# Patient Record
Sex: Male | Born: 1975 | Race: White | Hispanic: No | Marital: Married | Smoking: Never smoker
Health system: Southern US, Community
[De-identification: ages and names within clinical notes are randomized; demographics above are authoritative.]

## PROBLEM LIST (undated history)

## (undated) DIAGNOSIS — M109 Gout, unspecified: Secondary | ICD-10-CM

## (undated) DIAGNOSIS — M1990 Unspecified osteoarthritis, unspecified site: Secondary | ICD-10-CM

## (undated) DIAGNOSIS — I10 Essential (primary) hypertension: Secondary | ICD-10-CM

## (undated) DIAGNOSIS — Z87442 Personal history of urinary calculi: Secondary | ICD-10-CM

## (undated) DIAGNOSIS — Z9889 Other specified postprocedural states: Secondary | ICD-10-CM

## (undated) HISTORY — PX: WISDOM TOOTH EXTRACTION: SHX21

## (undated) HISTORY — PX: KNEE SURGERY: SHX244

## (undated) HISTORY — PX: CERVICAL FUSION: SHX112

## (undated) HISTORY — DX: Essential (primary) hypertension: I10

---

## 2006-10-01 ENCOUNTER — Emergency Department (HOSPITAL_COMMUNITY): Admission: EM | Admit: 2006-10-01 | Discharge: 2006-10-02 | Payer: Self-pay | Admitting: Emergency Medicine

## 2008-11-01 IMAGING — CT CT ABDOMEN W/O CM
2 of 4 series · 14 of 32 positions shown, 19 images · IV contrast (agent unspecified)
Comparison: None available.

CLINICAL DATA: Right flank pain.  
 ABDOMEN CT WITHOUT CONTRAST:
TECHNIQUE: Multidetector CT imaging of the abdomen was performed following the standard protocol without IV contrast.
TECHNIQUE: Multidetector CT imaging of the pelvis was performed following the standard protocol without IV contrast.

[Series 2: routine abdomen · axial · 0.74mm/px · z∈[-502,-157]mm · 6 of 96 slices shown, 11 images]
[im 14/96  soft-tissue]
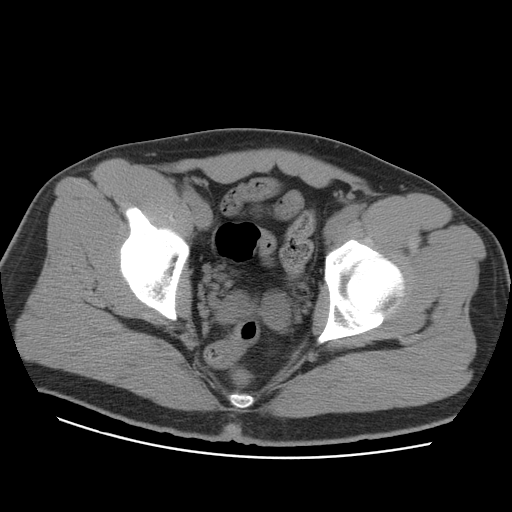
[im 14/96  bone]
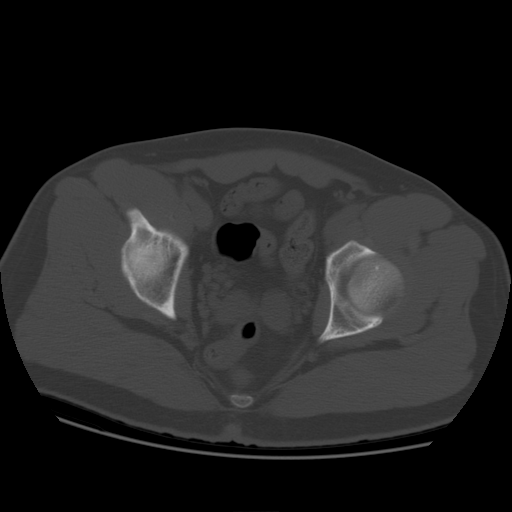
[im 28/96  soft-tissue]
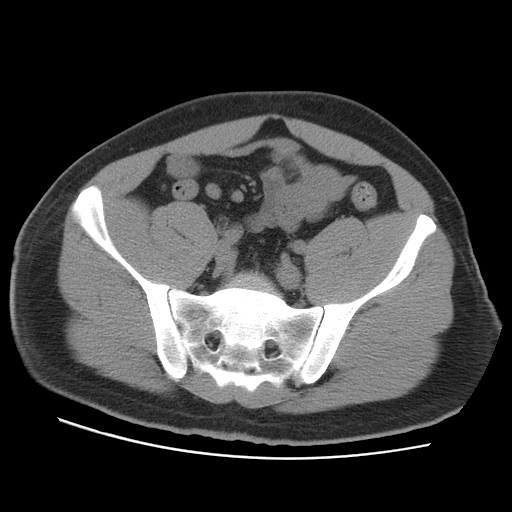
[im 41/96  soft-tissue]
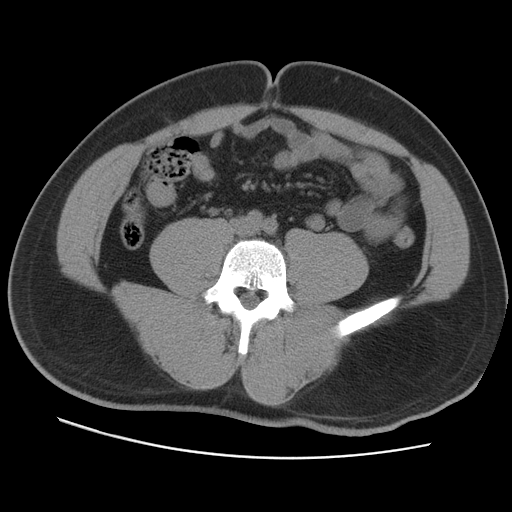
[im 41/96  lung]
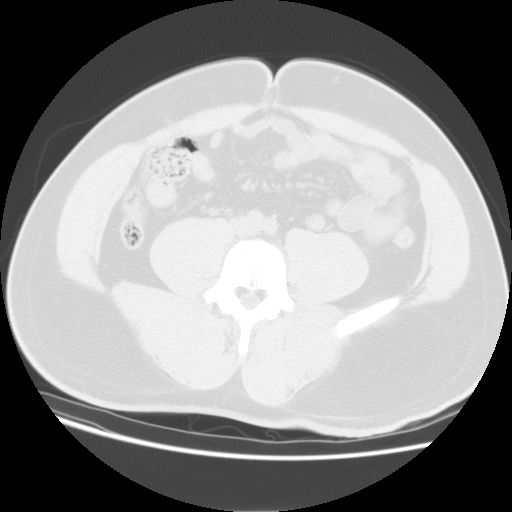
[im 55/96  soft-tissue]
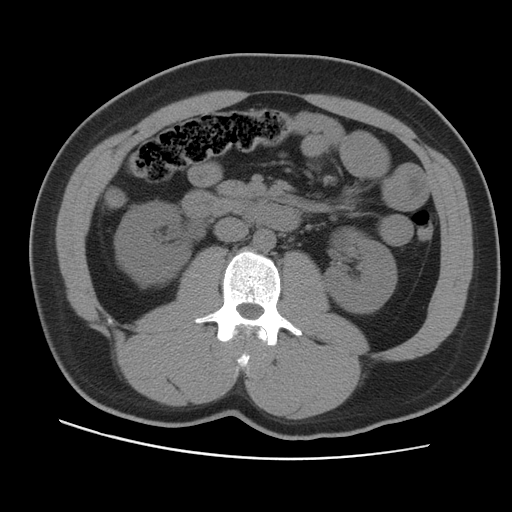
[im 55/96  lung]
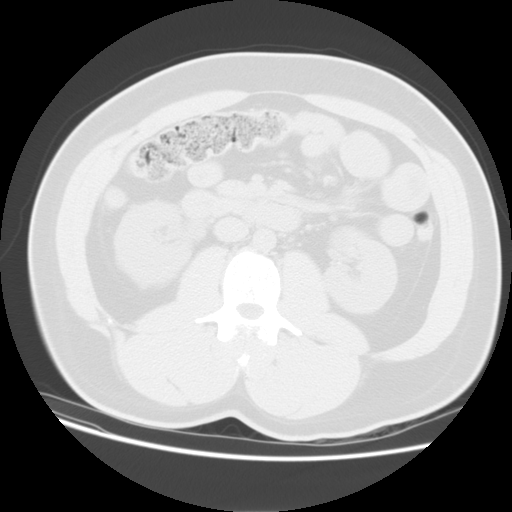
[im 68/96  soft-tissue]
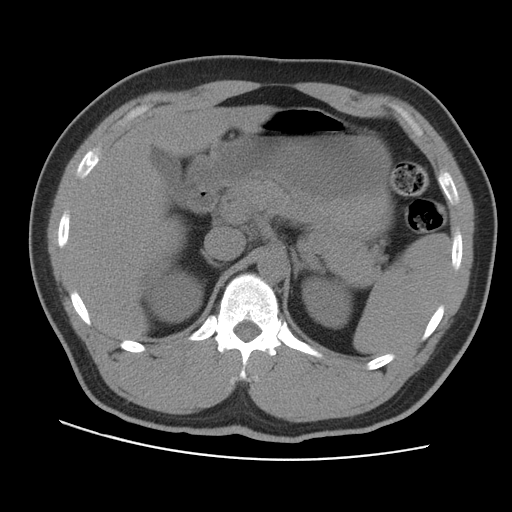
[im 68/96  lung]
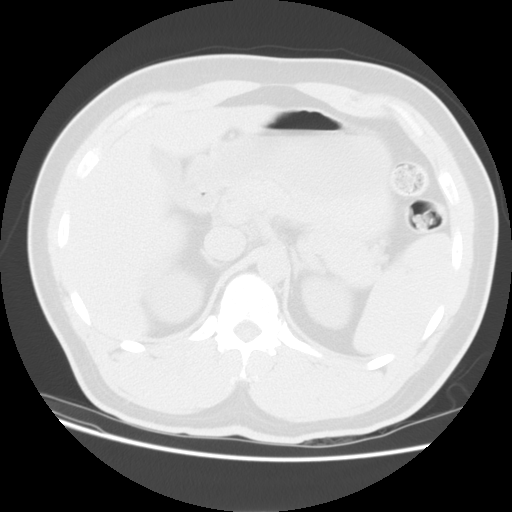
[im 82/96  soft-tissue]
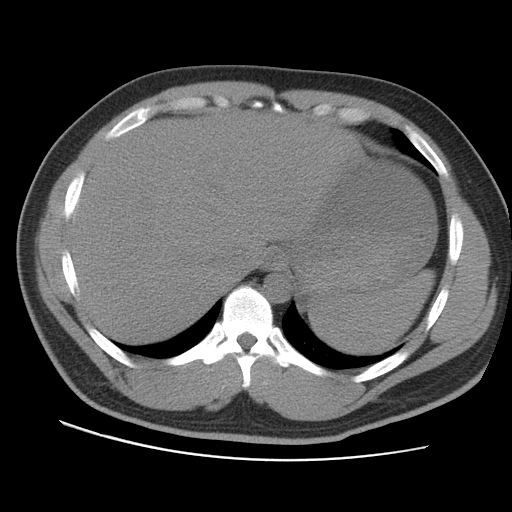
[im 82/96  lung]
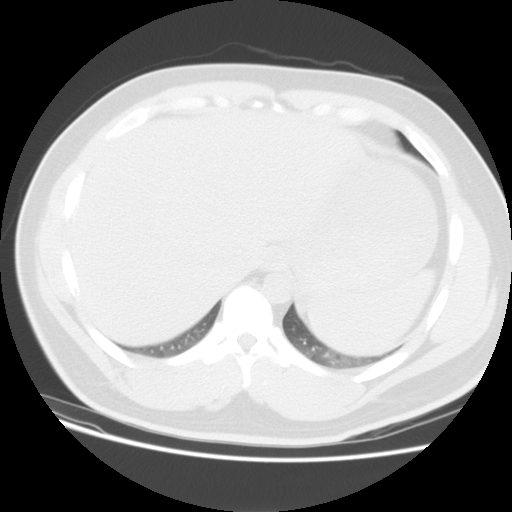

[Series 400: reformatted · sagittal · 1.03mm/px · 8 of 172 slices shown]
[im 16/172  soft-tissue]
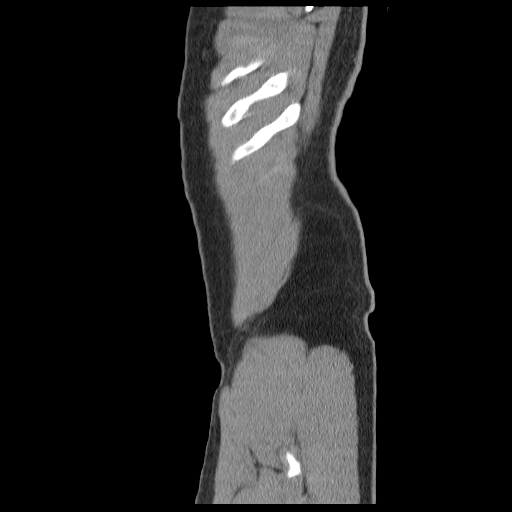
[im 32/172  soft-tissue]
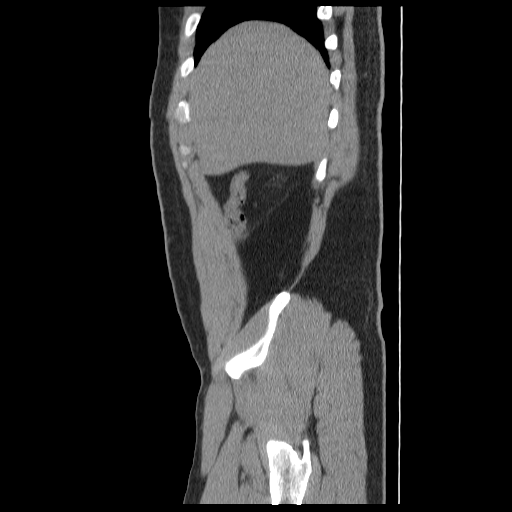
[im 63/172  soft-tissue]
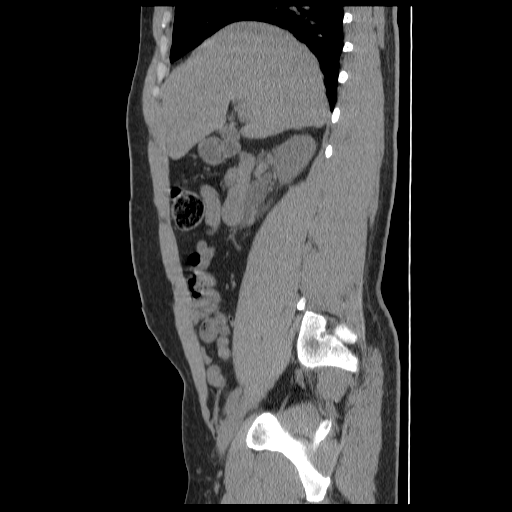
[im 78/172  soft-tissue]
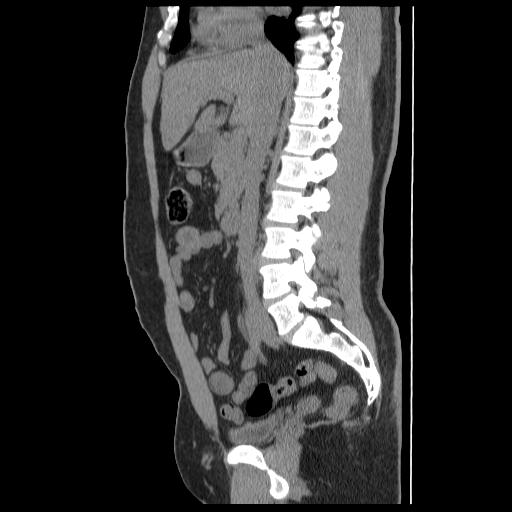
[im 94/172  soft-tissue]
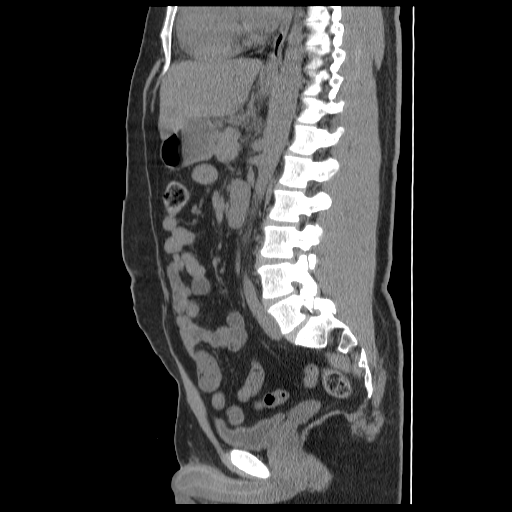
[im 109/172  soft-tissue]
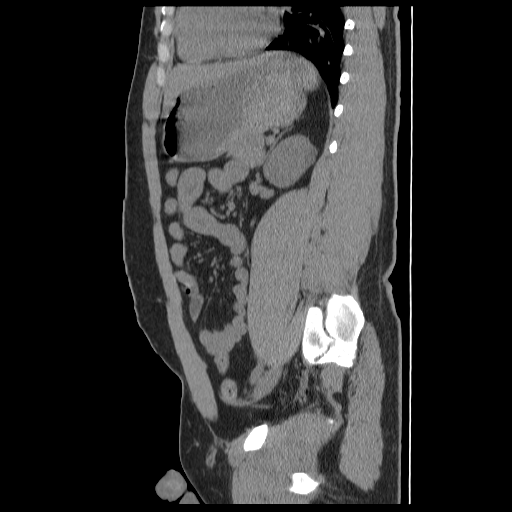
[im 140/172  soft-tissue]
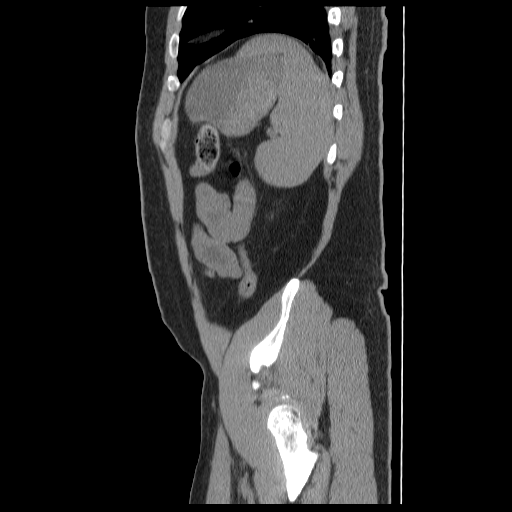
[im 156/172  soft-tissue]
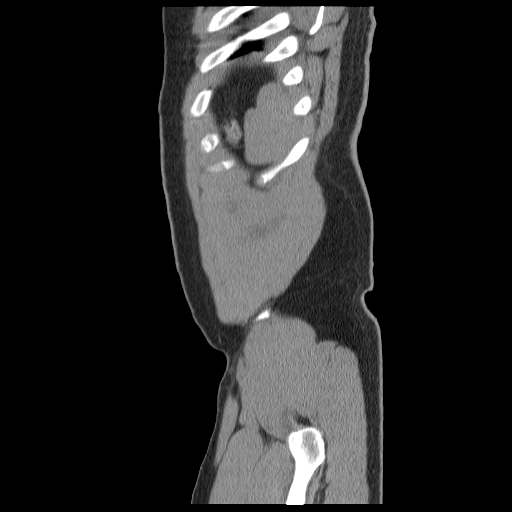

[14 of 32 positions shown; findings below may reference images not displayed]

FINDINGS: No focal abnormality is seen in the liver or spleen on this uninfused stomach.  The stomach is mildly distended.  The duodenum, pancreas, gallbladder and adrenal glands are unremarkable.  
 The right kidney is edematous with some perinephric stranding.  A 2 mm nonobstructing stone is identified in the interpolar right kidney.  There is stranding/edema around the right renal pelvis and proximal right ureter.  No stones are seen in the left kidney.  A 1.9 cm low-density lesion in the anterior left kidney is associated with a slightly exophytic 14 mm posterior low-density left renal lesion. 
 No intraperitoneal free fluid.  No evidence for abdominal lymphadenopathy.
IMPRESSION: 1.  Secondary changes in the right kidney with a 2 mm nonobstructing right renal stone. 
 2.  Two low-density lesions in the left kidney are likely cysts, but cannot be definitively characterized on this uninfused CT scan.  Ultrasound follow-up may prove helpful to confirm that these represent simple cysts. 
 PELVIS CT WITHOUT CONTRAST:
FINDINGS: A 3-4 mm calculus is identified in the right ureterovesical junction.  No evidence for left ureteral stone.  The bladder is decompressed.  There is no intraperitoneal free fluid.  No evidence for pelvic lymphadenopathy.  Terminal ileum is unremarkable.  The appendix has normal features. 
 Bone windows demonstrate bilateral pars defects at the L5 level.
IMPRESSION: A 3-4 mm right UVJ stone causes mild secondary changes in the right kidney.

## 2013-03-27 ENCOUNTER — Encounter: Payer: Self-pay | Admitting: Podiatrist

## 2013-04-01 ENCOUNTER — Ambulatory Visit (INDEPENDENT_AMBULATORY_CARE_PROVIDER_SITE_OTHER): Payer: BC Managed Care – PPO | Admitting: Podiatrist

## 2013-04-01 ENCOUNTER — Encounter: Payer: Self-pay | Admitting: Podiatrist

## 2013-04-01 VITALS — BP 117/81 | HR 89 | Resp 18

## 2013-04-01 DIAGNOSIS — M898X9 Other specified disorders of bone, unspecified site: Secondary | ICD-10-CM

## 2013-04-01 NOTE — Progress Notes (Signed)
Subjective:  Pt. Presents today for follow up biopsy of lesion Left great toenail .  States it is uncomfortable when he flexes down his toe.  He has been taking the clindamycin as instructed but states it is hurting his stomach.  Has also been taking the antiinflammatory medication for the plantar fasciitis right- relates it as being unchanged Objective:  Excellent appearance  Of left great toenail where biopsy and nail was removed along the medial border. No signs of infection present.  Neurovascular status intact and unchanged from last visit.   Assessment:  soft tissue tumor Left Great toe.     Plantar Fasciitis Right Plan:  Recommended continued covering of lesion for 1-2 weeks until full healing has occurred.  May leave open to air at night.  To continue conservative care for plantar fasciitis.  If no improvement, injection would be warranted.  Pathology:  Subungual Exostosis  Marlowe Aschoff, DPM

## 2013-04-01 NOTE — Patient Instructions (Signed)
Keep dressing on the toe during the day for the next 1-2 weeks.  If any signs of infection arise, please call

## 2014-12-30 ENCOUNTER — Ambulatory Visit (INDEPENDENT_AMBULATORY_CARE_PROVIDER_SITE_OTHER): Payer: BLUE CROSS/BLUE SHIELD | Admitting: Podiatry

## 2014-12-30 ENCOUNTER — Ambulatory Visit (INDEPENDENT_AMBULATORY_CARE_PROVIDER_SITE_OTHER): Payer: BLUE CROSS/BLUE SHIELD

## 2014-12-30 ENCOUNTER — Encounter: Payer: Self-pay | Admitting: Podiatry

## 2014-12-30 VITALS — BP 127/74 | HR 123 | Resp 15

## 2014-12-30 DIAGNOSIS — R609 Edema, unspecified: Secondary | ICD-10-CM | POA: Diagnosis not present

## 2014-12-30 DIAGNOSIS — M79671 Pain in right foot: Secondary | ICD-10-CM | POA: Diagnosis not present

## 2014-12-30 DIAGNOSIS — M779 Enthesopathy, unspecified: Secondary | ICD-10-CM

## 2014-12-30 MED ORDER — DICLOFENAC SODIUM 75 MG PO TBEC: 75.0000 mg | DELAYED_RELEASE_TABLET | Freq: Two times a day (BID) | ORAL | Status: DC

## 2014-12-30 NOTE — Progress Notes (Signed)
   Subjective:    Patient ID: Jason HarmanStacy L Mata, male    DOB: June 05, 1976, 39 y.o.   MRN: [id number]  HPI Pt presents with pain in right foot from previous injury 1 week prior, he was on a water slide and states that he rolled his foot backward. He states there has been intermittent pain since but recently pain has gotten severe. C/o pain on dorsal area of foot radiating upward   Review of Systems  All other systems reviewed and are negative.      Objective:   Physical Exam        Assessment & Plan:

## 2014-12-31 NOTE — Progress Notes (Signed)
Subjective:     Patient ID: Jason HarmanStacy L Mata, male   DOB: [birth year], 39 y.o.   MRN: [id number]  HPI patient presents stating that he injured his right big toe week ago on a water slide and that it seemed to be doing okay and that it got worse. It's gotten a little bit better now again but still can bother him   Review of Systems     Objective:   Physical Exam Neurovascular status intact muscle strength adequate with no other changes in health history with good range of motion of the first MPJ no crepitus and no indication of a tear of the capsule    Assessment:     Probable inflammation of the right first MPJ with no current indications of soft tissue injury but cannot rule out bone injury    Plan:     H&P and x-rays reviewed and placed on oral anti-inflammatory ice therapy and rigid bottom shoes. If symptoms persist patient is to reappoint

## 2015-11-29 DIAGNOSIS — R002 Palpitations: Secondary | ICD-10-CM | POA: Insufficient documentation

## 2015-11-30 ENCOUNTER — Ambulatory Visit (INDEPENDENT_AMBULATORY_CARE_PROVIDER_SITE_OTHER): Payer: BLUE CROSS/BLUE SHIELD

## 2015-11-30 DIAGNOSIS — R002 Palpitations: Secondary | ICD-10-CM

## 2017-06-20 DIAGNOSIS — I10 Essential (primary) hypertension: Secondary | ICD-10-CM | POA: Diagnosis not present

## 2017-06-20 DIAGNOSIS — E785 Hyperlipidemia, unspecified: Secondary | ICD-10-CM | POA: Diagnosis not present

## 2017-06-20 DIAGNOSIS — S30863A Insect bite (nonvenomous) of scrotum and testes, initial encounter: Secondary | ICD-10-CM | POA: Diagnosis not present

## 2017-06-20 DIAGNOSIS — M2550 Pain in unspecified joint: Secondary | ICD-10-CM | POA: Diagnosis not present

## 2017-06-20 DIAGNOSIS — Z0000 Encounter for general adult medical examination without abnormal findings: Secondary | ICD-10-CM | POA: Diagnosis not present

## 2017-06-20 DIAGNOSIS — M109 Gout, unspecified: Secondary | ICD-10-CM | POA: Diagnosis not present

## 2017-07-19 DIAGNOSIS — G4710 Hypersomnia, unspecified: Secondary | ICD-10-CM | POA: Diagnosis not present

## 2017-12-18 DIAGNOSIS — E785 Hyperlipidemia, unspecified: Secondary | ICD-10-CM | POA: Diagnosis not present

## 2017-12-18 DIAGNOSIS — M109 Gout, unspecified: Secondary | ICD-10-CM | POA: Diagnosis not present

## 2017-12-18 DIAGNOSIS — I10 Essential (primary) hypertension: Secondary | ICD-10-CM | POA: Diagnosis not present

## 2018-05-07 DIAGNOSIS — G8929 Other chronic pain: Secondary | ICD-10-CM | POA: Diagnosis not present

## 2018-05-07 DIAGNOSIS — M542 Cervicalgia: Secondary | ICD-10-CM | POA: Diagnosis not present

## 2018-05-07 DIAGNOSIS — M545 Low back pain: Secondary | ICD-10-CM | POA: Diagnosis not present

## 2018-05-15 DIAGNOSIS — I10 Essential (primary) hypertension: Secondary | ICD-10-CM | POA: Diagnosis not present

## 2018-05-16 DIAGNOSIS — Z135 Encounter for screening for eye and ear disorders: Secondary | ICD-10-CM | POA: Diagnosis not present

## 2018-05-16 DIAGNOSIS — M542 Cervicalgia: Secondary | ICD-10-CM | POA: Diagnosis not present

## 2018-05-16 DIAGNOSIS — M4802 Spinal stenosis, cervical region: Secondary | ICD-10-CM | POA: Diagnosis not present

## 2018-05-21 DIAGNOSIS — Z6833 Body mass index (BMI) 33.0-33.9, adult: Secondary | ICD-10-CM | POA: Diagnosis not present

## 2018-05-21 DIAGNOSIS — M47816 Spondylosis without myelopathy or radiculopathy, lumbar region: Secondary | ICD-10-CM | POA: Diagnosis not present

## 2018-05-27 DIAGNOSIS — Z6833 Body mass index (BMI) 33.0-33.9, adult: Secondary | ICD-10-CM | POA: Diagnosis not present

## 2018-05-27 DIAGNOSIS — M5416 Radiculopathy, lumbar region: Secondary | ICD-10-CM | POA: Diagnosis not present

## 2018-05-28 DIAGNOSIS — M5416 Radiculopathy, lumbar region: Secondary | ICD-10-CM | POA: Diagnosis not present

## 2018-05-28 DIAGNOSIS — M545 Low back pain: Secondary | ICD-10-CM | POA: Diagnosis not present

## 2018-05-28 DIAGNOSIS — M5412 Radiculopathy, cervical region: Secondary | ICD-10-CM | POA: Diagnosis not present

## 2018-05-30 DIAGNOSIS — M5412 Radiculopathy, cervical region: Secondary | ICD-10-CM | POA: Diagnosis not present

## 2018-05-30 DIAGNOSIS — M5416 Radiculopathy, lumbar region: Secondary | ICD-10-CM | POA: Diagnosis not present

## 2018-05-30 DIAGNOSIS — M545 Low back pain: Secondary | ICD-10-CM | POA: Diagnosis not present

## 2018-06-04 DIAGNOSIS — M5416 Radiculopathy, lumbar region: Secondary | ICD-10-CM | POA: Diagnosis not present

## 2018-06-04 DIAGNOSIS — M545 Low back pain: Secondary | ICD-10-CM | POA: Diagnosis not present

## 2018-06-04 DIAGNOSIS — M5412 Radiculopathy, cervical region: Secondary | ICD-10-CM | POA: Diagnosis not present

## 2018-06-07 DIAGNOSIS — M5416 Radiculopathy, lumbar region: Secondary | ICD-10-CM | POA: Diagnosis not present

## 2018-06-07 DIAGNOSIS — M545 Low back pain: Secondary | ICD-10-CM | POA: Diagnosis not present

## 2018-06-07 DIAGNOSIS — M5412 Radiculopathy, cervical region: Secondary | ICD-10-CM | POA: Diagnosis not present

## 2018-06-12 DIAGNOSIS — M545 Low back pain: Secondary | ICD-10-CM | POA: Diagnosis not present

## 2018-06-12 DIAGNOSIS — M5416 Radiculopathy, lumbar region: Secondary | ICD-10-CM | POA: Diagnosis not present

## 2018-06-12 DIAGNOSIS — M5412 Radiculopathy, cervical region: Secondary | ICD-10-CM | POA: Diagnosis not present

## 2018-06-13 DIAGNOSIS — M5412 Radiculopathy, cervical region: Secondary | ICD-10-CM | POA: Diagnosis not present

## 2018-06-13 DIAGNOSIS — M5416 Radiculopathy, lumbar region: Secondary | ICD-10-CM | POA: Diagnosis not present

## 2018-06-13 DIAGNOSIS — M545 Low back pain: Secondary | ICD-10-CM | POA: Diagnosis not present

## 2018-06-16 DIAGNOSIS — M545 Low back pain: Secondary | ICD-10-CM | POA: Diagnosis not present

## 2018-06-16 DIAGNOSIS — M5416 Radiculopathy, lumbar region: Secondary | ICD-10-CM | POA: Diagnosis not present

## 2018-06-16 DIAGNOSIS — M5412 Radiculopathy, cervical region: Secondary | ICD-10-CM | POA: Diagnosis not present

## 2018-06-20 DIAGNOSIS — M545 Low back pain: Secondary | ICD-10-CM | POA: Diagnosis not present

## 2018-06-20 DIAGNOSIS — M5416 Radiculopathy, lumbar region: Secondary | ICD-10-CM | POA: Diagnosis not present

## 2018-06-21 DIAGNOSIS — M5412 Radiculopathy, cervical region: Secondary | ICD-10-CM | POA: Diagnosis not present

## 2018-06-21 DIAGNOSIS — M545 Low back pain: Secondary | ICD-10-CM | POA: Diagnosis not present

## 2018-06-21 DIAGNOSIS — M5416 Radiculopathy, lumbar region: Secondary | ICD-10-CM | POA: Diagnosis not present

## 2018-06-24 DIAGNOSIS — M5416 Radiculopathy, lumbar region: Secondary | ICD-10-CM | POA: Diagnosis not present

## 2018-06-24 DIAGNOSIS — M5412 Radiculopathy, cervical region: Secondary | ICD-10-CM | POA: Diagnosis not present

## 2018-06-24 DIAGNOSIS — M545 Low back pain: Secondary | ICD-10-CM | POA: Diagnosis not present

## 2018-06-28 DIAGNOSIS — M5416 Radiculopathy, lumbar region: Secondary | ICD-10-CM | POA: Diagnosis not present

## 2018-06-28 DIAGNOSIS — M545 Low back pain: Secondary | ICD-10-CM | POA: Diagnosis not present

## 2018-06-28 DIAGNOSIS — M5412 Radiculopathy, cervical region: Secondary | ICD-10-CM | POA: Diagnosis not present

## 2018-07-01 DIAGNOSIS — M5416 Radiculopathy, lumbar region: Secondary | ICD-10-CM | POA: Diagnosis not present

## 2018-07-01 DIAGNOSIS — M5412 Radiculopathy, cervical region: Secondary | ICD-10-CM | POA: Diagnosis not present

## 2018-07-01 DIAGNOSIS — M545 Low back pain: Secondary | ICD-10-CM | POA: Diagnosis not present

## 2018-07-08 DIAGNOSIS — Z0000 Encounter for general adult medical examination without abnormal findings: Secondary | ICD-10-CM | POA: Diagnosis not present

## 2018-07-08 DIAGNOSIS — I10 Essential (primary) hypertension: Secondary | ICD-10-CM | POA: Diagnosis not present

## 2018-07-08 DIAGNOSIS — M109 Gout, unspecified: Secondary | ICD-10-CM | POA: Diagnosis not present

## 2018-07-08 DIAGNOSIS — E785 Hyperlipidemia, unspecified: Secondary | ICD-10-CM | POA: Diagnosis not present

## 2018-07-16 DIAGNOSIS — M4317 Spondylolisthesis, lumbosacral region: Secondary | ICD-10-CM | POA: Diagnosis not present

## 2019-01-10 DIAGNOSIS — E785 Hyperlipidemia, unspecified: Secondary | ICD-10-CM | POA: Diagnosis not present

## 2020-04-23 ENCOUNTER — Encounter: Payer: Self-pay | Admitting: Sports Medicine

## 2020-04-23 ENCOUNTER — Other Ambulatory Visit: Payer: Self-pay

## 2020-04-23 ENCOUNTER — Ambulatory Visit (INDEPENDENT_AMBULATORY_CARE_PROVIDER_SITE_OTHER): Payer: 59

## 2020-04-23 ENCOUNTER — Ambulatory Visit: Payer: BLUE CROSS/BLUE SHIELD | Admitting: Sports Medicine

## 2020-04-23 DIAGNOSIS — M722 Plantar fascial fibromatosis: Secondary | ICD-10-CM | POA: Diagnosis not present

## 2020-04-23 DIAGNOSIS — Q667 Congenital pes cavus, unspecified foot: Secondary | ICD-10-CM

## 2020-04-23 DIAGNOSIS — M79672 Pain in left foot: Secondary | ICD-10-CM

## 2020-04-23 DIAGNOSIS — M79671 Pain in right foot: Secondary | ICD-10-CM | POA: Diagnosis not present

## 2020-04-23 DIAGNOSIS — Q6672 Congenital pes cavus, left foot: Secondary | ICD-10-CM

## 2020-04-23 DIAGNOSIS — M205X9 Other deformities of toe(s) (acquired), unspecified foot: Secondary | ICD-10-CM | POA: Diagnosis not present

## 2020-04-23 DIAGNOSIS — Q6671 Congenital pes cavus, right foot: Secondary | ICD-10-CM | POA: Diagnosis not present

## 2020-04-23 NOTE — Progress Notes (Signed)
Subjective: Jason Mata is a 44 y.o. male patient presents to office with complaint of general foot pain to both reports that he has high arches and some soreness and a bump of bone that he thinks that is a bunion at the big toe joint left greater than right reports that when he has worn over-the-counter orthotics that seem to help but they do not last long and is interested in getting custom orthotics.  Patient denies any other pedal complaints at this time..   Patient Active Problem List   Diagnosis Date Noted  . Palpitations 11/29/2015  . 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 atorvastatin (LIPITOR) 20 MG tablet Take 20 mg by mouth daily.    Marland Kitchen lisinopril (PRINIVIL,ZESTRIL) 10 MG tablet Take 10 mg by mouth daily.     No current facility-administered medications on file prior to visit.    Allergies  Allergen Reactions  . Valium [Diazepam]     Objective: Physical Exam General: The patient is alert and oriented x3 in no acute distress.  Dermatology: Skin is warm, dry and supple bilateral lower extremities. Nails 1-10 are normal. There is no erythema, edema, no eccymosis, no open lesions present. Integument is otherwise unremarkable.  Vascular: Dorsalis Pedis pulse and Posterior Tibial pulse are 2/4 bilateral. Capillary fill time is immediate to all digits.  Neurological: Grossly intact to light touch bilateral.  Musculoskeletal: No reproducible tenderness to palpation to plantar fascia at right or left foot however patient does have a history of heel pain that is much improved when he wears orthotics.  Patient has a high arch/pes cavus foot type.  There is limited first MPJ range of motion bilateral left greater than right with medial eminence consistent with bunion and hallux rigidus.  Patient has a history of gout.   Xray, Right/Left foot:  Normal osseous mineralization. Joint spaces preserved  except at first MPJs bilateral where there is joint space narrowing and significant medial eminence left greater than right consistent with bunion and hallux rigidus and also history of gout.  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  No fracture/dislocation/boney destruction. Calcaneal spur present with mild thickening of plantar fascia. No other soft tissue abnormalities or radiopaque foreign bodies.   Assessment and Plan: Problem List Items Addressed This Visit    None    Visit Diagnoses    High arches    -  Primary   Relevant Orders   DG Foot Complete Right   DG Foot Complete Left   Hallux limitus, unspecified laterality       Plantar fasciitis, bilateral       Foot pain, bilateral          -Complete examination performed.  -Xrays reviewed -Discussed with patient in detail foot type and need for more customized support since he currently wears out his over-the-counter orthotics very quickly -Foam box impression was obtained this visit and prescription was written for which he left for custom functional foot orthotics -Office will call patient to discuss orthotic benefits -Explained and dispensed to patient daily stretching exercises. -Recommend patient to ice affected area 1-2x daily as needed for any soreness to feet -Patient to return to office when called to pick up orthotics or sooner if problems or questions arise. Asencion Islam, DPM

## 2020-04-23 NOTE — Patient Instructions (Addendum)
Diagonisis code: M72.2 fasciitis                             Q66.7  Cavus                              M20.5x9 limitus  Orthotic Code: [id number]

## 2020-09-30 DIAGNOSIS — Z0000 Encounter for general adult medical examination without abnormal findings: Secondary | ICD-10-CM | POA: Diagnosis not present

## 2020-09-30 DIAGNOSIS — I10 Essential (primary) hypertension: Secondary | ICD-10-CM | POA: Diagnosis not present

## 2020-09-30 DIAGNOSIS — M109 Gout, unspecified: Secondary | ICD-10-CM | POA: Diagnosis not present

## 2020-09-30 DIAGNOSIS — M722 Plantar fascial fibromatosis: Secondary | ICD-10-CM | POA: Diagnosis not present

## 2020-09-30 DIAGNOSIS — E785 Hyperlipidemia, unspecified: Secondary | ICD-10-CM | POA: Diagnosis not present

## 2021-03-27 DIAGNOSIS — M545 Low back pain, unspecified: Secondary | ICD-10-CM | POA: Diagnosis not present

## 2021-04-15 DIAGNOSIS — E785 Hyperlipidemia, unspecified: Secondary | ICD-10-CM | POA: Diagnosis not present

## 2021-04-15 DIAGNOSIS — M109 Gout, unspecified: Secondary | ICD-10-CM | POA: Diagnosis not present

## 2021-04-15 DIAGNOSIS — I10 Essential (primary) hypertension: Secondary | ICD-10-CM | POA: Diagnosis not present

## 2023-03-28 ENCOUNTER — Emergency Department (HOSPITAL_COMMUNITY)
Admission: EM | Admit: 2023-03-28 | Discharge: 2023-03-29 | Disposition: A | Discharge status: Home / Self Care | Payer: No Typology Code available for payment source | Attending: Emergency Medicine | Admitting: Emergency Medicine

## 2023-03-28 ENCOUNTER — Emergency Department (HOSPITAL_COMMUNITY): Payer: No Typology Code available for payment source

## 2023-03-28 DIAGNOSIS — S50311D Abrasion of right elbow, subsequent encounter: Secondary | ICD-10-CM | POA: Insufficient documentation

## 2023-03-28 DIAGNOSIS — I10 Essential (primary) hypertension: Secondary | ICD-10-CM | POA: Insufficient documentation

## 2023-03-28 DIAGNOSIS — S2241XD Multiple fractures of ribs, right side, subsequent encounter for fracture with routine healing: Secondary | ICD-10-CM | POA: Diagnosis not present

## 2023-03-28 DIAGNOSIS — Z79899 Other long term (current) drug therapy: Secondary | ICD-10-CM | POA: Insufficient documentation

## 2023-03-28 DIAGNOSIS — S299XXD Unspecified injury of thorax, subsequent encounter: Secondary | ICD-10-CM | POA: Diagnosis present

## 2023-03-28 MED ORDER — ONDANSETRON HCL 4 MG/2ML IJ SOLN
4.0000 mg | Freq: Once | INTRAMUSCULAR | Status: AC
  Administered 2023-03-28: 4 mg via INTRAVENOUS
  Filled 2023-03-28: qty 2

## 2023-03-28 MED ORDER — ACETAMINOPHEN 500 MG PO TABS
1000.0000 mg | ORAL_TABLET | Freq: Once | ORAL | Status: AC
  Administered 2023-03-28: 1000 mg via ORAL
  Filled 2023-03-28: qty 2

## 2023-03-28 MED ORDER — LIDOCAINE 5 % EX PTCH
1.0000 | MEDICATED_PATCH | Freq: Once | CUTANEOUS | Status: DC
  Administered 2023-03-28: 1 via TRANSDERMAL
  Filled 2023-03-28: qty 1

## 2023-03-28 MED ORDER — HYDROMORPHONE HCL 1 MG/ML IJ SOLN
1.0000 mg | Freq: Once | INTRAMUSCULAR | Status: AC
  Administered 2023-03-28: 1 mg via INTRAVENOUS
  Filled 2023-03-28: qty 1

## 2023-03-28 MED ORDER — KETOROLAC TROMETHAMINE 15 MG/ML IJ SOLN
15.0000 mg | Freq: Once | INTRAMUSCULAR | Status: AC
  Administered 2023-03-28: 15 mg via INTRAVENOUS
  Filled 2023-03-28: qty 1
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
## 2023-03-29 LAB — BASIC METABOLIC PANEL
Anion gap: 16 — ABNORMAL HIGH (ref 5–15)
BUN: 14 mg/dL (ref 6–20)
CO2: 24 mmol/L (ref 22–32)
Calcium: 9.6 mg/dL (ref 8.9–10.3)
Chloride: 99 mmol/L (ref 98–111)
Creatinine, Ser: 1.12 mg/dL (ref 0.61–1.24)
GFR, Estimated: >60 mL/min (ref >60)
Glucose, Bld: 109 mg/dL — ABNORMAL HIGH (ref 70–99)
Potassium: 4.6 mmol/L (ref 3.5–5.1)
Sodium: 139 mmol/L (ref 135–145)

## 2023-03-29 LAB — CBC WITH DIFFERENTIAL/PLATELET
Abs Immature Granulocytes: 0.03 10*3/uL (ref 0.00–0.07)
Basophils Absolute: 0.1 10*3/uL (ref 0.0–0.1)
Basophils Relative: 1 %
Eosinophils Absolute: 0.1 10*3/uL (ref 0.0–0.5)
Eosinophils Relative: 1 %
HCT: 52.2 % — ABNORMAL HIGH (ref 39.0–52.0)
Hemoglobin: 18.1 g/dL — ABNORMAL HIGH (ref 13.0–17.0)
Immature Granulocytes: 0 %
Lymphocytes Relative: 12 %
Lymphs Abs: 1.2 10*3/uL (ref 0.7–4.0)
MCH: 31.7 pg (ref 26.0–34.0)
MCHC: 34.7 g/dL (ref 30.0–36.0)
MCV: 91.4 fL (ref 80.0–100.0)
Monocytes Absolute: 0.9 10*3/uL (ref 0.1–1.0)
Monocytes Relative: 9 %
Neutro Abs: 7.7 10*3/uL (ref 1.7–7.7)
Neutrophils Relative %: 77 %
Platelets: 239 10*3/uL (ref 150–400)
RBC: 5.71 MIL/uL (ref 4.22–5.81)
RDW: 13.2 % (ref 11.5–15.5)
WBC: 10 10*3/uL (ref 4.0–10.5)
nRBC: 0 % (ref 0.0–0.2)

## 2023-03-29 MED ORDER — HYDROMORPHONE HCL 2 MG PO TABS: 2.0000 mg | ORAL_TABLET | ORAL | 0 refills | Status: AC | PRN

## 2023-03-29 MED ORDER — OXYCODONE HCL 5 MG PO TABS
5.0000 mg | ORAL_TABLET | Freq: Once | ORAL | Status: AC
  Administered 2023-03-29: 5 mg via ORAL
  Filled 2023-03-29: qty 1

## 2023-03-29 MED ORDER — LIDOCAINE 5 % EX PTCH: 1.0000 | MEDICATED_PATCH | Freq: Every day | CUTANEOUS | 0 refills | Status: AC | PRN

## 2023-03-29 MED ORDER — METHOCARBAMOL 500 MG PO TABS: 1000.0000 mg | ORAL_TABLET | Freq: Three times a day (TID) | ORAL | 0 refills | Status: AC

## 2023-03-29 MED ORDER — ACETAMINOPHEN 325 MG PO TABS: 650.0000 mg | ORAL_TABLET | Freq: Four times a day (QID) | ORAL | 0 refills | Status: AC | PRN

## 2023-03-29 MED ORDER — IBUPROFEN 600 MG PO TABS: 600.0000 mg | ORAL_TABLET | Freq: Four times a day (QID) | ORAL | 0 refills | Status: AC | PRN

## 2023-03-29 NOTE — ED Notes (Signed)
Incentive spirometer given with training and education.PT demonstrated to me how to use it successfully after training
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
## 2023-05-27 DIAGNOSIS — K429 Umbilical hernia without obstruction or gangrene: Secondary | ICD-10-CM | POA: Diagnosis not present

## 2023-05-27 DIAGNOSIS — L089 Local infection of the skin and subcutaneous tissue, unspecified: Secondary | ICD-10-CM | POA: Diagnosis not present

## 2023-05-30 DIAGNOSIS — K429 Umbilical hernia without obstruction or gangrene: Secondary | ICD-10-CM | POA: Diagnosis not present

## 2023-06-15 ENCOUNTER — Ambulatory Visit: Payer: Self-pay | Admitting: Surgery

## 2023-06-15 DIAGNOSIS — K429 Umbilical hernia without obstruction or gangrene: Secondary | ICD-10-CM | POA: Diagnosis not present

## 2023-07-11 NOTE — Patient Instructions (Addendum)
SURGICAL WAITING ROOM VISITATION Patients having surgery or a procedure may have no more than 2 support people in the waiting area - these visitors may rotate.    Children under the age of 78 must have an adult with them who is not the patient.  Due to an increase in RSV and influenza rates and associated hospitalizations, children ages 40 and under may not visit patients in [facility] hospitals.   If the patient needs to stay at the hospital during part of their recovery, the visitor guidelines for inpatient rooms apply. Pre-op nurse will coordinate an appropriate time for 1 support person to accompany patient in pre-op.  This support person may not rotate.    Please refer to the [facility] website for the visitor guidelines for Inpatients (after your surgery is over and you are in a regular room).       Your procedure is scheduled on: 07-20-23   Report to [facility] Main Entrance    Report to admitting at 7:45 AM   Call this number if you have problems the morning of surgery [phone]   Do not eat food :After Midnight.   After Midnight you may have the following liquids until 7:00 AM DAY OF SURGERY  Water Non-Citrus Juices (without pulp, NO RED-Apple, White grape, White cranberry) Black Coffee (NO MILK/CREAM OR CREAMERS, sugar ok)  Clear Tea (NO MILK/CREAM OR CREAMERS, sugar ok) regular and decaf                             Plain Jell-O (NO RED)                                           Fruit ices (not with fruit pulp, NO RED)                                     Popsicles (NO RED)                                                               Sports drinks like Gatorade (NO RED)                      If you have questions, please contact your surgeon's office.   FOLLOW  ANY ADDITIONAL PRE OP INSTRUCTIONS YOU RECEIVED FROM YOUR SURGEON'S OFFICE!!!     Oral Hygiene is also important to reduce your risk of infection.                                    Remember -  BRUSH YOUR TEETH THE MORNING OF SURGERY WITH YOUR REGULAR TOOTHPASTE   Do NOT smoke after Midnight   Take these medicines the morning of surgery with A SIP OF WATER:   Allopurinol  Atorvastatin  Stop all vitamins and herbal supplements 7 days before surgery  You may not have any metal on your body including  jewelry, and body piercing             Do not wear  lotions, powders, cologne, or deodorant              Men may shave face and neck.   Do not bring valuables to the hospital. [facility] IS NOT RESPONSIBLE   FOR VALUABLES.   Contacts, dentures or bridgework may not be worn into surgery.   DO NOT BRING YOUR HOME MEDICATIONS TO THE HOSPITAL. PHARMACY WILL DISPENSE MEDICATIONS LISTED ON YOUR MEDICATION LIST TO YOU DURING YOUR ADMISSION IN THE HOSPITAL!    Patients discharged on the day of surgery will not be allowed to drive home.  Someone NEEDS to stay with you for the first 24 hours after anesthesia.              Please read over the following fact sheets you were given: IF YOU HAVE QUESTIONS ABOUT YOUR PRE-OP INSTRUCTIONS PLEASE CALL [phone] G[facility]  If you received a COVID test during your pre-op visit  it is requested that you wear a mask when out in public, stay away from anyone that may not be feeling well and notify your surgeon if you develop symptoms. If you test positive for Covid or have been in contact with anyone that has tested positive in the last 10 days please notify you surgeon.  [facility] - Preparing for Surgery Before surgery, you can play an important role.  Because skin is not sterile, your skin needs to be as free of germs as possible.  You can reduce the number of germs on your skin by washing with CHG (chlorahexidine gluconate) soap before surgery.  CHG is an antiseptic cleaner which kills germs and bonds with the skin to continue killing germs even after washing. Please DO NOT use if you have an allergy to CHG or  antibacterial soaps.  If your skin becomes reddened/irritated stop using the CHG and inform your nurse when you arrive at Short Stay. Do not shave (including legs and underarms) for at least 48 hours prior to the first CHG shower.  You may shave your face/neck.  Please follow these instructions carefully:  1.  Shower with CHG Soap the night before surgery and the  morning of surgery.  2.  If you choose to wash your hair, wash your hair first as usual with your normal  shampoo.  3.  After you shampoo, rinse your hair and body thoroughly to remove the shampoo.                             4.  Use CHG as you would any other liquid soap.  You can apply chg directly to the skin and wash.  Gently with a scrungie or clean washcloth.  5.  Apply the CHG Soap to your body ONLY FROM THE NECK DOWN.   Do   not use on face/ open                           Wound or open sores. Avoid contact with eyes, ears mouth and   genitals (private parts).                       Wash face,  Genitals (private parts) with your normal soap.  6.  Wash thoroughly, paying special attention to the area where your    surgery  will be performed.  7.  Thoroughly rinse your body with warm water from the neck down.  8.  DO NOT shower/wash with your normal soap after using and rinsing off the CHG Soap.                9.  Pat yourself dry with a clean towel.            10.  Wear clean pajamas.            11.  Place clean sheets on your bed the night of your first shower and do not  sleep with pets. Day of Surgery : Do not apply any lotions/deodorants the morning of surgery.  Please wear clean clothes to the hospital/surgery center.  FAILURE TO FOLLOW THESE INSTRUCTIONS MAY RESULT IN THE CANCELLATION OF YOUR SURGERY  PATIENT SIGNATURE_________________________________  NURSE SIGNATURE__________________________________  ________________________________________________________________________

## 2023-07-12 NOTE — Progress Notes (Addendum)
COVID Vaccine Completed:  Date of COVID positive in last 90 days:  No  PCP - [facility] Cardiologist - N/A  Chest x-ray - CT chest 03-28-23 Epic EKG - 07-13-23 Epic Stress Test -  N/A ECHO -  N/A Cardiac Cath -  N/A Pacemaker/ICD device last checked: Spinal Cord Stimulator:N/A Non-telemetry monitor - 11-30-15 Epic  Bowel Prep -  N/A  Sleep Study - Yes, home study but patient states that he did not sleep well and not sure results were accurate CPAP -  No  Fasting Blood Sugar - N/A Checks Blood Sugar _____ times a day  Last dose of GLP1 agonist-  N/A GLP1 instructions:  Hold 7 days before surgery    Last dose of SGLT-2 inhibitors-  N/A SGLT-2 instructions:  Hold 3 days before surgery   Blood Thinner Instructions:N/A Aspirin Instructions: Last Dose:  Activity level:  Can go up a flight of stairs and perform activities of daily living without stopping and without symptoms of chest pain or shortness of breath.  Anesthesia review:  Stop Bang 6  Patient denies shortness of breath, fever, cough and chest pain at PAT appointment  Patient verbalized understanding of instructions that were given to them at the PAT appointment. Patient was also instructed that they will need to review over the PAT instructions again at home before surgery.

## 2023-07-13 ENCOUNTER — Other Ambulatory Visit: Payer: Self-pay

## 2023-07-13 ENCOUNTER — Encounter (HOSPITAL_COMMUNITY)
Admission: RE | Admit: 2023-07-13 | Discharge: 2023-07-13 | Disposition: A | Discharge status: Home / Self Care | Payer: BC Managed Care – PPO | Source: Ambulatory Visit | Attending: Surgery | Admitting: Surgery

## 2023-07-13 ENCOUNTER — Encounter (HOSPITAL_COMMUNITY): Payer: Self-pay

## 2023-07-13 VITALS — BP 139/98 | HR 93 | Temp 97.8°F | Resp 16 | Ht 70.0 in | Wt 252.4 lb

## 2023-07-13 DIAGNOSIS — I10 Essential (primary) hypertension: Secondary | ICD-10-CM | POA: Diagnosis not present

## 2023-07-13 DIAGNOSIS — Z01818 Encounter for other preprocedural examination: Secondary | ICD-10-CM | POA: Insufficient documentation

## 2023-07-13 HISTORY — DX: Unspecified osteoarthritis, unspecified site: M19.90

## 2023-07-13 HISTORY — DX: Personal history of urinary calculi: Z87.442

## 2023-07-13 HISTORY — DX: Other specified postprocedural states: Z98.890

## 2023-07-13 HISTORY — DX: Gout, unspecified: M10.9

## 2023-07-13 LAB — CBC
HCT: 48.3 % (ref 39.0–52.0)
Hemoglobin: 16.8 g/dL (ref 13.0–17.0)
MCH: 32.3 pg (ref 26.0–34.0)
MCHC: 34.8 g/dL (ref 30.0–36.0)
MCV: 92.9 fL (ref 80.0–100.0)
Platelets: 226 10*3/uL (ref 150–400)
RBC: 5.2 MIL/uL (ref 4.22–5.81)
RDW: 12.9 % (ref 11.5–15.5)
WBC: 5.3 10*3/uL (ref 4.0–10.5)
nRBC: 0 % (ref 0.0–0.2)

## 2023-07-13 LAB — BASIC METABOLIC PANEL
Anion gap: 9 (ref 5–15)
BUN: 11 mg/dL (ref 6–20)
CO2: 25 mmol/L (ref 22–32)
Calcium: 8.6 mg/dL — ABNORMAL LOW (ref 8.9–10.3)
Chloride: 101 mmol/L (ref 98–111)
Creatinine, Ser: 0.91 mg/dL (ref 0.61–1.24)
GFR, Estimated: >60 mL/min (ref >60)
Glucose, Bld: 133 mg/dL — ABNORMAL HIGH (ref 70–99)
Potassium: 3.6 mmol/L (ref 3.5–5.1)
Sodium: 135 mmol/L (ref 135–145)

## 2023-07-13 NOTE — Progress Notes (Signed)
   07/13/23 0850  OBSTRUCTIVE SLEEP APNEA  Have you ever been diagnosed with sleep apnea through a sleep study? No  Do you snore loudly (loud enough to be heard through closed doors)?  1  Do you often feel tired, fatigued, or sleepy during the daytime (such as falling asleep during driving or talking to someone)? 0  Has anyone observed you stop breathing during your sleep? 1  Do you have, or are you being treated for high blood pressure? 1  BMI more than 35 kg/m2? 1  Age > 50 (1-yes) 0  Neck circumference greater than:Male 16 inches or larger, Male 17inches or larger? 1  Male Gender (Yes=1) 1  Obstructive Sleep Apnea Score 6  Score 5 or greater  Results sent to PCP

## 2023-07-19 NOTE — Anesthesia Preprocedure Evaluation (Addendum)
Anesthesia Evaluation  Patient identified by MRN, date of birth, ID band Patient awake    Reviewed: Allergy & Precautions, NPO status , Patient's Chart, lab work & pertinent test results, reviewed documented beta blocker date and time   History of Anesthesia Complications (+) PONV and history of anesthetic complications  Airway Mallampati: II       Dental no notable dental hx. (+) Teeth Intact, Dental Advisory Given   Pulmonary neg pulmonary ROS   Pulmonary exam normal breath sounds clear to auscultation       Cardiovascular hypertension, Pt. on medications Normal cardiovascular exam Rhythm:Regular Rate:Normal     Neuro/Psych negative neurological ROS  negative psych ROS   GI/Hepatic negative GI ROS, Neg liver ROS,,,  Endo/Other  Obesity  Renal/GU negative Renal ROS  negative genitourinary   Musculoskeletal  (+) Arthritis , Osteoarthritis,  Umbilical hernia   Abdominal  (+) + obese  Peds  Hematology negative hematology ROS (+)   Anesthesia Other Findings   Reproductive/Obstetrics                             Anesthesia Physical Anesthesia Plan  ASA: 2  Anesthesia Plan: General   Post-op Pain Management: Minimal or no pain anticipated, Tylenol PO (pre-op)* and Precedex   Induction: Intravenous  PONV Risk Score and Plan: 4 or greater and Treatment may vary due to age or medical condition, Midazolam, Ondansetron, TIVA, Diphenhydramine and Dexamethasone  Airway Management Planned: LMA  Additional Equipment: None  Intra-op Plan:   Post-operative Plan: Extubation in OR  Informed Consent: I have reviewed the patients History and Physical, chart, labs and discussed the procedure including the risks, benefits and alternatives for the proposed anesthesia with the patient or authorized representative who has indicated his/her understanding and acceptance.     Dental advisory  given  Plan Discussed with: Anesthesiologist and CRNA  Anesthesia Plan Comments:         Anesthesia Quick Evaluation

## 2023-07-20 ENCOUNTER — Ambulatory Visit (HOSPITAL_COMMUNITY): Payer: BC Managed Care – PPO | Admitting: Anesthesiology

## 2023-07-20 ENCOUNTER — Encounter (HOSPITAL_COMMUNITY): Payer: Self-pay | Admitting: Surgery

## 2023-07-20 ENCOUNTER — Other Ambulatory Visit: Payer: Self-pay

## 2023-07-20 ENCOUNTER — Ambulatory Visit (HOSPITAL_COMMUNITY)
Admission: RE | Admit: 2023-07-20 | Discharge: 2023-07-20 | Disposition: A | Discharge status: Home / Self Care | Payer: BC Managed Care – PPO | Source: Ambulatory Visit | Attending: Surgery | Admitting: Surgery

## 2023-07-20 ENCOUNTER — Encounter (HOSPITAL_COMMUNITY)
Admission: RE | Disposition: A | Discharge status: Home / Self Care | Payer: Self-pay | Source: Ambulatory Visit | Attending: Surgery

## 2023-07-20 DIAGNOSIS — Z79899 Other long term (current) drug therapy: Secondary | ICD-10-CM | POA: Insufficient documentation

## 2023-07-20 DIAGNOSIS — K429 Umbilical hernia without obstruction or gangrene: Secondary | ICD-10-CM | POA: Diagnosis not present

## 2023-07-20 DIAGNOSIS — I10 Essential (primary) hypertension: Secondary | ICD-10-CM | POA: Diagnosis not present

## 2023-07-20 HISTORY — PX: UMBILICAL HERNIA REPAIR: SHX196

## 2023-07-20 SURGERY — REPAIR, HERNIA, UMBILICAL, ADULT
Anesthesia: General

## 2023-07-20 MED ORDER — HYDROMORPHONE HCL 1 MG/ML IJ SOLN
INTRAMUSCULAR | Status: AC
  Filled 2023-07-20: qty 1

## 2023-07-20 MED ORDER — ONDANSETRON HCL 4 MG/2ML IJ SOLN
INTRAMUSCULAR | Status: AC
  Filled 2023-07-20: qty 2

## 2023-07-20 MED ORDER — MIDAZOLAM HCL 2 MG/2ML IJ SOLN
INTRAMUSCULAR | Status: DC | PRN
  Administered 2023-07-20: 2 mg via INTRAVENOUS

## 2023-07-20 MED ORDER — DROPERIDOL 2.5 MG/ML IJ SOLN
0.6250 mg | Freq: Once | INTRAMUSCULAR | Status: DC | PRN
Start: 2023-07-20 — End: 2023-07-20

## 2023-07-20 MED ORDER — LACTATED RINGERS IV SOLN: INTRAVENOUS | Status: DC

## 2023-07-20 MED ORDER — DEXAMETHASONE SODIUM PHOSPHATE 10 MG/ML IJ SOLN
INTRAMUSCULAR | Status: DC | PRN
  Administered 2023-07-20: 10 mg via INTRAVENOUS

## 2023-07-20 MED ORDER — LIDOCAINE 2% (20 MG/ML) 5 ML SYRINGE
INTRAMUSCULAR | Status: DC | PRN
  Administered 2023-07-20: 80 mg via INTRAVENOUS

## 2023-07-20 MED ORDER — ACETAMINOPHEN 500 MG PO TABS
1000.0000 mg | ORAL_TABLET | ORAL | Status: AC
  Administered 2023-07-20: 1000 mg via ORAL
  Filled 2023-07-20: qty 2

## 2023-07-20 MED ORDER — MIDAZOLAM HCL 2 MG/2ML IJ SOLN
INTRAMUSCULAR | Status: AC
  Filled 2023-07-20: qty 2

## 2023-07-20 MED ORDER — PROPOFOL 10 MG/ML IV BOLUS
INTRAVENOUS | Status: AC
  Filled 2023-07-20: qty 20

## 2023-07-20 MED ORDER — CEFAZOLIN SODIUM-DEXTROSE 2-4 GM/100ML-% IV SOLN
2.0000 g | INTRAVENOUS | Status: AC
  Administered 2023-07-20: 2 g via INTRAVENOUS
  Filled 2023-07-20: qty 100

## 2023-07-20 MED ORDER — ONDANSETRON HCL 4 MG/2ML IJ SOLN
4.0000 mg | Freq: Once | INTRAMUSCULAR | Status: DC | PRN
Start: 2023-07-20 — End: 2023-07-20

## 2023-07-20 MED ORDER — LIDOCAINE HCL (PF) 2 % IJ SOLN
INTRAMUSCULAR | Status: AC
Start: 2023-07-20 — End: ?
  Filled 2023-07-20: qty 5

## 2023-07-20 MED ORDER — BUPIVACAINE-EPINEPHRINE 0.25% -1:200000 IJ SOLN
INTRAMUSCULAR | Status: AC
  Filled 2023-07-20: qty 1

## 2023-07-20 MED ORDER — HYDROMORPHONE HCL 1 MG/ML IJ SOLN
0.2500 mg | INTRAMUSCULAR | Status: DC | PRN
  Administered 2023-07-20: 0.5 mg via INTRAVENOUS

## 2023-07-20 MED ORDER — CHLORHEXIDINE GLUCONATE 0.12 % MT SOLN
15.0000 mL | Freq: Once | OROMUCOSAL | Status: AC
  Administered 2023-07-20: 15 mL via OROMUCOSAL

## 2023-07-20 MED ORDER — ROCURONIUM BROMIDE 10 MG/ML (PF) SYRINGE
PREFILLED_SYRINGE | INTRAVENOUS | Status: AC
  Filled 2023-07-20: qty 10

## 2023-07-20 MED ORDER — SUGAMMADEX SODIUM 200 MG/2ML IV SOLN
INTRAVENOUS | Status: DC | PRN
  Administered 2023-07-20: 300 mg via INTRAVENOUS

## 2023-07-20 MED ORDER — OXYCODONE HCL 5 MG/5ML PO SOLN: 5.0000 mg | Freq: Once | ORAL | Status: DC | PRN

## 2023-07-20 MED ORDER — OXYCODONE HCL 5 MG PO TABS
5.0000 mg | ORAL_TABLET | Freq: Once | ORAL | Status: DC | PRN
Start: 2023-07-20 — End: 2023-07-20

## 2023-07-20 MED ORDER — CHLORHEXIDINE GLUCONATE CLOTH 2 % EX PADS: 6.0000 | MEDICATED_PAD | Freq: Once | CUTANEOUS | Status: DC

## 2023-07-20 MED ORDER — PHENYLEPHRINE 80 MCG/ML (10ML) SYRINGE FOR IV PUSH (FOR BLOOD PRESSURE SUPPORT)
PREFILLED_SYRINGE | INTRAVENOUS | Status: DC | PRN
  Administered 2023-07-20 (×3): 160 ug via INTRAVENOUS

## 2023-07-20 MED ORDER — FENTANYL CITRATE PF 50 MCG/ML IJ SOSY: 50.0000 ug | PREFILLED_SYRINGE | INTRAMUSCULAR | Status: DC

## 2023-07-20 MED ORDER — IBUPROFEN 800 MG PO TABS: 800.0000 mg | ORAL_TABLET | Freq: Three times a day (TID) | ORAL | 0 refills | Status: AC | PRN

## 2023-07-20 MED ORDER — GABAPENTIN 300 MG PO CAPS
300.0000 mg | ORAL_CAPSULE | ORAL | Status: AC
  Administered 2023-07-20: 300 mg via ORAL
  Filled 2023-07-20: qty 1

## 2023-07-20 MED ORDER — ROCURONIUM BROMIDE 10 MG/ML (PF) SYRINGE
PREFILLED_SYRINGE | INTRAVENOUS | Status: DC | PRN
  Administered 2023-07-20: 70 mg via INTRAVENOUS

## 2023-07-20 MED ORDER — FENTANYL CITRATE (PF) 100 MCG/2ML IJ SOLN
INTRAMUSCULAR | Status: DC | PRN
  Administered 2023-07-20: 100 ug via INTRAVENOUS

## 2023-07-20 MED ORDER — 0.9 % SODIUM CHLORIDE (POUR BTL) OPTIME
TOPICAL | Status: DC | PRN
  Administered 2023-07-20: 1000 mL

## 2023-07-20 MED ORDER — FENTANYL CITRATE (PF) 100 MCG/2ML IJ SOLN
INTRAMUSCULAR | Status: AC
  Filled 2023-07-20: qty 2

## 2023-07-20 MED ORDER — ONDANSETRON HCL 4 MG/2ML IJ SOLN
INTRAMUSCULAR | Status: DC | PRN
  Administered 2023-07-20: 4 mg via INTRAVENOUS

## 2023-07-20 MED ORDER — DIPHENHYDRAMINE HCL 50 MG/ML IJ SOLN
INTRAMUSCULAR | Status: DC | PRN
  Administered 2023-07-20: 12.5 mg via INTRAVENOUS

## 2023-07-20 MED ORDER — DEXAMETHASONE SODIUM PHOSPHATE 10 MG/ML IJ SOLN
INTRAMUSCULAR | Status: AC
Start: 2023-07-20 — End: ?
  Filled 2023-07-20: qty 1

## 2023-07-20 MED ORDER — PROPOFOL 10 MG/ML IV BOLUS
INTRAVENOUS | Status: DC | PRN
  Administered 2023-07-20: 180 mg via INTRAVENOUS

## 2023-07-20 MED ORDER — ORAL CARE MOUTH RINSE
15.0000 mL | Freq: Once | OROMUCOSAL | Status: AC
Start: 2023-07-20 — End: 2023-07-20

## 2023-07-20 MED ORDER — BUPIVACAINE-EPINEPHRINE 0.25% -1:200000 IJ SOLN
INTRAMUSCULAR | Status: DC | PRN
  Administered 2023-07-20: 20 mL

## 2023-07-20 MED ORDER — DIPHENHYDRAMINE HCL 50 MG/ML IJ SOLN
INTRAMUSCULAR | Status: AC
  Filled 2023-07-20: qty 1

## 2023-07-20 MED ORDER — OXYCODONE HCL 5 MG PO TABS: 5.0000 mg | ORAL_TABLET | Freq: Four times a day (QID) | ORAL | 0 refills | Status: AC | PRN

## 2023-07-20 MED ORDER — PHENYLEPHRINE 80 MCG/ML (10ML) SYRINGE FOR IV PUSH (FOR BLOOD PRESSURE SUPPORT)
PREFILLED_SYRINGE | INTRAVENOUS | Status: AC
  Filled 2023-07-20: qty 10

## 2023-07-20 SURGICAL SUPPLY — 29 items
BAG COUNTER SPONGE SURGICOUNT (BAG) IMPLANT
BENZOIN TINCTURE PRP APPL 2/3 (GAUZE/BANDAGES/DRESSINGS) ×1 IMPLANT
BINDER ABDOMINAL 12 ML 46-62 (SOFTGOODS) IMPLANT
BLADE HEX COATED 2.75 (ELECTRODE) ×1 IMPLANT
DERMABOND ADVANCED .7 DNX12 (GAUZE/BANDAGES/DRESSINGS) IMPLANT
DRAPE LAPAROTOMY T 102X78X121 (DRAPES) ×1 IMPLANT
DRSG TEGADERM 4X4.75 (GAUZE/BANDAGES/DRESSINGS) IMPLANT
ELECT REM PT RETURN 15FT ADLT (MISCELLANEOUS) ×1 IMPLANT
GAUZE SPONGE 4X4 12PLY STRL (GAUZE/BANDAGES/DRESSINGS) ×1 IMPLANT
GLOVE BIOGEL PI IND STRL 7.0 (GLOVE) ×1 IMPLANT
GLOVE INDICATOR 8.0 STRL GRN (GLOVE) ×2 IMPLANT
GLOVE SS BIOGEL STRL SZ 7.5 (GLOVE) ×1 IMPLANT
GOWN STRL REUS W/ TWL XL LVL3 (GOWN DISPOSABLE) ×2 IMPLANT
KIT BASIN OR (CUSTOM PROCEDURE TRAY) ×1 IMPLANT
KIT TURNOVER KIT A (KITS) IMPLANT
MESH VENTRALEX ST 1-7/10 CRC S (Mesh General) IMPLANT
NDL HYPO 22X1.5 SAFETY MO (MISCELLANEOUS) ×1 IMPLANT
NEEDLE HYPO 22X1.5 SAFETY MO (MISCELLANEOUS) ×1
NS IRRIG 1000ML POUR BTL (IV SOLUTION) ×1 IMPLANT
PACK GENERAL/GYN (CUSTOM PROCEDURE TRAY) ×1 IMPLANT
SPIKE FLUID TRANSFER (MISCELLANEOUS) ×1 IMPLANT
STRIP CLOSURE SKIN 1/2X4 (GAUZE/BANDAGES/DRESSINGS) IMPLANT
SUT MNCRL AB 4-0 PS2 18 (SUTURE) ×1 IMPLANT
SUT NOVA 1 T20/GS 25DT (SUTURE) IMPLANT
SUT PROLENE 0 CT 1 30 (SUTURE) IMPLANT
SUT PROLENE 0 CT 1 CR/8 (SUTURE) IMPLANT
SUT VIC AB 2-0 SH 27XBRD (SUTURE) IMPLANT
SYR CONTROL 10ML LL (SYRINGE) ×1 IMPLANT
TOWEL OR 17X26 10 PK STRL BLUE (TOWEL DISPOSABLE) ×1 IMPLANT

## 2023-07-20 NOTE — Op Note (Signed)
Jason Mata [birth year] [id number] 07/20/2023  Preoperative diagnosis: 2 cm reducible umbilical hernia  Postoperative diagnosis: Same  Procedure: Umbilical Hernia Repair with 4.3 cm coated mesh   Surgeon: Harriette Bouillon, MD, FACS  Anesthesia: General and 0.25% Marcaine with epinephrine    Clinical History and Indications: 48 year old male with symptomatic umbilical hernia.  He presents today for repair.  Risks and benefits reviewed.The risk of hernia repair include bleeding,  Infection,   Recurrence of the hernia,  Mesh use, chronic pain,  Organ injury,  Bowel injury,  Bladder injury,   nerve injury with numbness around the incision,  Death,  and worsening of preexisting  medical problems.  The alternatives to surgery have been discussed as well..  Long term expectations of both operative and non operative treatments have been discussed.   The patient agrees to proceed.   Procedure: The patient was seen in the preoperative area and the plans for the procedure reviewed again. He  had no further questions. I marked the area of the umbilicus as the operative site. He wishes to prodeed.  The patient was taken to the operating room and after satisfactory general anesthesia had been obtained the area was clipped as needed, prepped and draped. The timeout was performed.  I used some 0.25% Marcaine with epinephrine local anesthesia to help with postoperative pain management. This was infiltrated around the umbilical area and additional infiltrated as I worked.  A curvilinear incision was made on the inferior aspect of the umbilicus. The umbilical skin was elevated off of the hernia sac. The hernia sac was dissected free of the subcutaneous tissues.  A 4.3 cm circular coated mesh was used in a subfascial position and placed into the preperitoneal space.  This was secured to the fascia with #1 Novafil suture circumferentially.  The fascia was closed over with #1 Novafil suture.  Once the repair was  complete the incision was closed by using some 3-0 Vicryl subcutaneous and 4-0 Monocryl subcuticular sutures.  The patient tolerated the procedure well. There were no operative complications. There was minimal blood loss. All counts were correct. He was taken to the PACU in satisfactory condition.  Harriette Bouillon, MD, FACS 07/20/2023 11:24 AM

## 2023-07-20 NOTE — Anesthesia Postprocedure Evaluation (Signed)
Anesthesia Post Note  Patient: Jason Mata  Procedure(s) Performed: OPEN UMBILICAL HERNIA REPAIR WITH MESH     Patient location during evaluation: PACU Anesthesia Type: General Level of consciousness: awake and alert and oriented Pain management: pain level controlled Vital Signs Assessment: post-procedure vital signs reviewed and stable Respiratory status: spontaneous breathing, nonlabored ventilation and respiratory function stable Cardiovascular status: blood pressure returned to baseline and stable Postop Assessment: no apparent nausea or vomiting Anesthetic complications: no   No notable events documented.  Last Vitals:  Vitals:   07/20/23 1207 07/20/23 1215  BP:  (!) 147/92  Pulse:  86  Resp:  14  Temp:    SpO2: (!) 85% 91%    Last Pain:  Vitals:   07/20/23 1215  TempSrc:   PainSc: Asleep                 Jaqua Ching A.
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
## 2023-07-20 NOTE — Discharge Instructions (Signed)
[facility] _______[facility]  UMBILICAL OR INGUINAL HERNIA REPAIR: POST OP INSTRUCTIONS  Always review your discharge instruction sheet given to you by the facility where your surgery was performed. IF YOU HAVE DISABILITY OR FAMILY LEAVE FORMS, YOU MUST BRING THEM TO THE OFFICE FOR PROCESSING.   DO NOT GIVE THEM TO YOUR DOCTOR.  1. A  prescription for pain medication may be given to you upon discharge.  Take your pain medication as prescribed, if needed.  If narcotic pain medicine is not needed, then you may take acetaminophen (Tylenol) or ibuprofen (Advil) as needed. 2. Take your usually prescribed medications unless otherwise directed. If you need a refill on your pain medication, please contact your pharmacy.  They will contact our office to request authorization. Prescriptions will not be filled after 5 pm or on week-ends. 3. You should follow a light diet the first 24 hours after arrival home, such as soup and crackers, etc.  Be sure to include lots of fluids daily.  Resume your normal diet the day after surgery. 4.Most patients will experience some swelling and bruising around the umbilicus or in the groin and scrotum.  Ice packs and reclining will help.  Swelling and bruising can take several days to resolve.  6. It is common to experience some constipation if taking pain medication after surgery.  Increasing fluid intake and taking a stool softener (such as Colace) will usually help or prevent this problem from occurring.  A mild laxative (Milk of Magnesia or Miralax) should be taken according to package directions if there are no bowel movements after 48 hours. 7. Unless discharge instructions indicate otherwise, you may remove your bandages 24-48 hours after surgery, and you may shower at that time.  You may have steri-strips (small skin tapes) in place directly over the incision.  These strips should be left on the skin for 7-10 days.  If your surgeon used skin glue on the  incision, you may shower in 24 hours.  The glue will flake off over the next 2-3 weeks.  Any sutures or staples will be removed at the office during your follow-up visit. 8. ACTIVITIES:  You may resume regular (light) daily activities beginning the next day--such as daily self-care, walking, climbing stairs--gradually increasing activities as tolerated.  You may have sexual intercourse when it is comfortable.  Refrain from any heavy lifting or straining until approved by your doctor.  a.You may drive when you are no longer taking prescription pain medication, you can comfortably wear a seatbelt, and you can safely maneuver your car and apply brakes. b.RETURN TO WORK:   _____________________________________________  9.You should see your doctor in the office for a follow-up appointment approximately 2-3 weeks after your surgery.  Make sure that you call for this appointment within a day or two after you arrive home to insure a convenient appointment time. 10.OTHER INSTRUCTIONS: _________________________    _____________________________________  WHEN TO CALL YOUR DOCTOR: Fever over 101.0 Inability to urinate Nausea and/or vomiting Extreme swelling or bruising Continued bleeding from incision. Increased pain, redness, or drainage from the incision  The clinic staff is available to answer your questions during regular business hours.  Please don't hesitate to call and ask to speak to one of the nurses for clinical concerns.  If you have a medical emergency, go to the nearest emergency room or call 911.  A surgeon from [facility] is always on call at the hospital   [address], [address],  [place], [place]  [address] ?  [address] [address], [place], [place]   [address] [phone] ? [phone] ? FAX [phone] Web site: [email or web address]

## 2023-07-20 NOTE — Transfer of Care (Signed)
Immediate Anesthesia Transfer of Care Note  Patient: Alric L Wanamaker  Procedure(s) Performed: OPEN UMBILICAL HERNIA REPAIR WITH MESH  Patient Location: PACU  Anesthesia Type:General  Level of Consciousness: drowsy  Airway & Oxygen Therapy: Patient Spontanous Breathing and Patient connected to face mask oxygen  Post-op Assessment: Report given to RN and Post -op Vital signs reviewed and stable  Post vital signs: Reviewed and stable  Last Vitals:  Vitals Value Taken Time  BP 142/109 07/20/23 1135  Temp 36.8 C 07/20/23 1135  Pulse 98 07/20/23 1135  Resp 17 07/20/23 1135  SpO2 99 % 07/20/23 1135  Vitals shown include unfiled device data.  Last Pain:  Vitals:   07/20/23 0817  TempSrc:   PainSc: 0-No pain         Complications: No notable events documented.

## 2023-07-20 NOTE — Interval H&P Note (Signed)
History and Physical Interval Note:  07/20/2023 10:18 AM  Jason Mata  has presented today for surgery, with the diagnosis of UMBILICAL HERNIA.  The various methods of treatment have been discussed with the patient and family. After consideration of risks, benefits and other options for treatment, the patient has consented to  Procedure(s): OPEN UMBILICAL HERNIA REPAIR WITH MESH (N/A) as a surgical intervention.  The patient's history has been reviewed, patient examined, no change in status, stable for surgery.  I have reviewed the patient's chart and labs.  Questions were answered to the patient's satisfaction.     Dolphus Linch A Marin Wisner

## 2023-07-20 NOTE — Anesthesia Procedure Notes (Signed)
Procedure Name: Intubation Date/Time: 07/20/2023 10:31 AM  Performed by: Florene Route, CRNAPre-anesthesia Checklist: Patient identified, Emergency Drugs available, Suction available and Patient being monitored Patient Re-evaluated:Patient Re-evaluated prior to induction Oxygen Delivery Method: Circle system utilized Preoxygenation: Pre-oxygenation with 100% oxygen Induction Type: IV induction Ventilation: Mask ventilation with difficulty, Oral airway inserted - appropriate to patient size and Two handed mask ventilation required Laryngoscope Size: Miller and 3 Grade View: Grade II Tube type: Oral Tube size: 8.0 mm Number of attempts: 1 Airway Equipment and Method: Stylet and Oral airway Placement Confirmation: ETT inserted through vocal cords under direct vision, positive ETCO2 and breath sounds checked- equal and bilateral Secured at: 23 cm Tube secured with: Tape Dental Injury: Teeth and Oropharynx as per pre-operative assessment

## 2023-07-21 ENCOUNTER — Encounter (HOSPITAL_COMMUNITY): Payer: Self-pay | Admitting: Surgery

## 2023-08-20 DIAGNOSIS — Z9889 Other specified postprocedural states: Secondary | ICD-10-CM | POA: Diagnosis not present

## 2023-11-26 DIAGNOSIS — J988 Other specified respiratory disorders: Secondary | ICD-10-CM | POA: Diagnosis not present

## 2024-06-04 DIAGNOSIS — I10 Essential (primary) hypertension: Secondary | ICD-10-CM | POA: Diagnosis not present

## 2024-06-04 DIAGNOSIS — R7309 Other abnormal glucose: Secondary | ICD-10-CM | POA: Diagnosis not present

## 2024-06-04 DIAGNOSIS — M109 Gout, unspecified: Secondary | ICD-10-CM | POA: Diagnosis not present

## 2024-06-04 DIAGNOSIS — Z125 Encounter for screening for malignant neoplasm of prostate: Secondary | ICD-10-CM | POA: Diagnosis not present

## 2024-06-04 DIAGNOSIS — E785 Hyperlipidemia, unspecified: Secondary | ICD-10-CM | POA: Diagnosis not present
